# Patient Record
Sex: Female | Born: 1990 | Race: White | Hispanic: No | Marital: Married | Smoking: Never smoker
Health system: Southern US, Community
[De-identification: ages and names within clinical notes are randomized; demographics above are authoritative.]

## PROBLEM LIST (undated history)

## (undated) DIAGNOSIS — Z9889 Other specified postprocedural states: Secondary | ICD-10-CM

## (undated) DIAGNOSIS — R112 Nausea with vomiting, unspecified: Secondary | ICD-10-CM

## (undated) DIAGNOSIS — Z789 Other specified health status: Secondary | ICD-10-CM

## (undated) HISTORY — PX: NO PAST SURGERIES: SHX2092

## (undated) HISTORY — PX: WISDOM TOOTH EXTRACTION: SHX21

## (undated) HISTORY — DX: Other specified health status: Z78.9

---

## 2016-12-01 NOTE — L&D Delivery Note (Signed)
Delivery Note Patient pushed for 30 minutes after she was noted to be C/C/+2.  At 9:03 AM a viable and healthy female was delivered via Vaginal, Spontaneous (Presentation: LOA ).  APGAR: 9, 9; weight pending.   Baby was placed on maternal abdomen and delayed cord clamping done Cord was double clamped and cut by father.  Cord blood obtained. Placenta spontaneously delivered intact 3 vessels.  Second degree perineal laceration repaired in routine fashion. Patient tolerated delivery Well, no complications  Anesthesia:   Episiotomy: None Lacerations: 2nd degree Suture Repair: 2.0 vicryl Est. Blood Loss (mL):  300  Mom to postpartum.  Baby to Couplet care / Skin to Skin.  Hiroshi Krummel, [place] 11/04/2017, 9:38 AM

## 2017-02-26 LAB — OB RESULTS CONSOLE RPR: RPR: NONREACTIVE

## 2017-02-26 LAB — OB RESULTS CONSOLE HIV ANTIBODY (ROUTINE TESTING): HIV: NONREACTIVE

## 2017-02-26 LAB — OB RESULTS CONSOLE GC/CHLAMYDIA
CHLAMYDIA, DNA PROBE: NEGATIVE
Gonorrhea: NEGATIVE

## 2017-02-26 LAB — OB RESULTS CONSOLE RUBELLA ANTIBODY, IGM: Rubella: IMMUNE

## 2017-02-26 LAB — OB RESULTS CONSOLE HEPATITIS B SURFACE ANTIGEN: HEP B S AG: NEGATIVE

## 2017-02-26 LAB — OB RESULTS CONSOLE ABO/RH: RH TYPE: POSITIVE

## 2017-02-26 LAB — OB RESULTS CONSOLE ANTIBODY SCREEN: ANTIBODY SCREEN: NEGATIVE

## 2017-10-01 LAB — OB RESULTS CONSOLE GBS: GBS: NEGATIVE

## 2017-10-10 ENCOUNTER — Inpatient Hospital Stay (HOSPITAL_COMMUNITY): Admission: AD | Admit: 2017-10-10 | Payer: Self-pay | Source: Ambulatory Visit | Admitting: Obstetrics & Gynecology

## 2017-10-27 ENCOUNTER — Telehealth (HOSPITAL_COMMUNITY): Payer: Self-pay | Admitting: *Deleted

## 2017-10-27 ENCOUNTER — Encounter (HOSPITAL_COMMUNITY): Payer: Self-pay | Admitting: *Deleted

## 2017-10-28 ENCOUNTER — Encounter (HOSPITAL_COMMUNITY): Payer: Self-pay | Admitting: *Deleted

## 2017-10-28 NOTE — Telephone Encounter (Signed)
Preadmission screen  

## 2017-11-02 ENCOUNTER — Other Ambulatory Visit: Payer: Self-pay | Admitting: Obstetrics & Gynecology

## 2017-11-03 ENCOUNTER — Encounter (HOSPITAL_COMMUNITY): Payer: Self-pay | Admitting: Certified Registered Nurse Anesthetist

## 2017-11-03 ENCOUNTER — Encounter (HOSPITAL_COMMUNITY): Payer: Self-pay

## 2017-11-03 ENCOUNTER — Inpatient Hospital Stay (HOSPITAL_COMMUNITY): Payer: Managed Care, Other (non HMO)

## 2017-11-03 ENCOUNTER — Inpatient Hospital Stay (HOSPITAL_COMMUNITY)
Admission: RE | Admit: 2017-11-03 | Discharge: 2017-11-06 | DRG: 807 | Disposition: A | Discharge status: Home / Self Care | Payer: Managed Care, Other (non HMO) | Source: Ambulatory Visit | Attending: Obstetrics & Gynecology | Admitting: Obstetrics & Gynecology

## 2017-11-03 DIAGNOSIS — O480 Post-term pregnancy: Secondary | ICD-10-CM | POA: Diagnosis present

## 2017-11-03 DIAGNOSIS — Z3A41 41 weeks gestation of pregnancy: Secondary | ICD-10-CM

## 2017-11-03 DIAGNOSIS — Z3493 Encounter for supervision of normal pregnancy, unspecified, third trimester: Secondary | ICD-10-CM

## 2017-11-03 DIAGNOSIS — Z3490 Encounter for supervision of normal pregnancy, unspecified, unspecified trimester: Secondary | ICD-10-CM

## 2017-11-03 LAB — CBC
HCT: 32.6 % — ABNORMAL LOW (ref 36.0–46.0)
Hemoglobin: 11.1 g/dL — ABNORMAL LOW (ref 12.0–15.0)
MCH: 30.3 pg (ref 26.0–34.0)
MCHC: 34 g/dL (ref 30.0–36.0)
MCV: 89.1 fL (ref 78.0–100.0)
PLATELETS: 188 10*3/uL (ref 150–400)
RBC: 3.66 MIL/uL — ABNORMAL LOW (ref 3.87–5.11)
RDW: 14.3 % (ref 11.5–15.5)
WBC: 10.9 10*3/uL — ABNORMAL HIGH (ref 4.0–10.5)

## 2017-11-03 LAB — TYPE AND SCREEN
ABO/RH(D): O POS
Antibody Screen: NEGATIVE

## 2017-11-03 LAB — ABO/RH: ABO/RH(D): O POS

## 2017-11-03 LAB — RPR: RPR: NONREACTIVE

## 2017-11-03 MED ORDER — NALBUPHINE HCL 10 MG/ML IJ SOLN
10.0000 mg | INTRAMUSCULAR | Status: DC | PRN
  Administered 2017-11-03: 10 mg via INTRAVENOUS
  Filled 2017-11-03: qty 1

## 2017-11-03 MED ORDER — FENTANYL 2.5 MCG/ML BUPIVACAINE 1/10 % EPIDURAL INFUSION (WH - ANES)
INTRAMUSCULAR | Status: AC
Start: 2017-11-03 — End: 2017-11-04
  Filled 2017-11-03: qty 100

## 2017-11-03 MED ORDER — LACTATED RINGERS IV SOLN
INTRAVENOUS | Status: DC
  Administered 2017-11-03 (×3): via INTRAVENOUS

## 2017-11-03 MED ORDER — LIDOCAINE HCL (PF) 1 % IJ SOLN
30.0000 mL | INTRAMUSCULAR | Status: DC | PRN
  Filled 2017-11-03: qty 30

## 2017-11-03 MED ORDER — OXYCODONE-ACETAMINOPHEN 5-325 MG PO TABS: 2.0000 | ORAL_TABLET | ORAL | Status: DC | PRN

## 2017-11-03 MED ORDER — ONDANSETRON HCL 4 MG/2ML IJ SOLN
4.0000 mg | Freq: Four times a day (QID) | INTRAMUSCULAR | Status: DC | PRN
  Administered 2017-11-04 (×2): 4 mg via INTRAVENOUS
  Filled 2017-11-03 (×2): qty 2

## 2017-11-03 MED ORDER — LACTATED RINGERS IV SOLN: 500.0000 mL | INTRAVENOUS | Status: DC | PRN

## 2017-11-03 MED ORDER — ACETAMINOPHEN 325 MG PO TABS: 650.0000 mg | ORAL_TABLET | ORAL | Status: DC | PRN

## 2017-11-03 MED ORDER — SOD CITRATE-CITRIC ACID 500-334 MG/5ML PO SOLN: 30.0000 mL | ORAL | Status: DC | PRN

## 2017-11-03 MED ORDER — PHENYLEPHRINE 40 MCG/ML (10ML) SYRINGE FOR IV PUSH (FOR BLOOD PRESSURE SUPPORT)
PREFILLED_SYRINGE | INTRAVENOUS | Status: AC
  Filled 2017-11-03: qty 20

## 2017-11-03 MED ORDER — OXYTOCIN 40 UNITS IN LACTATED RINGERS INFUSION - SIMPLE MED: 2.5000 [IU]/h | INTRAVENOUS | Status: DC

## 2017-11-03 MED ORDER — FLEET ENEMA 7-19 GM/118ML RE ENEM: 1.0000 | ENEMA | RECTAL | Status: DC | PRN

## 2017-11-03 MED ORDER — FENTANYL CITRATE (PF) 100 MCG/2ML IJ SOLN
50.0000 ug | INTRAMUSCULAR | Status: DC | PRN
  Filled 2017-11-03: qty 2

## 2017-11-03 MED ORDER — LACTATED RINGERS IV SOLN
500.0000 mL | INTRAVENOUS | Status: DC | PRN
  Administered 2017-11-04: 500 mL via INTRAVENOUS

## 2017-11-03 MED ORDER — TERBUTALINE SULFATE 1 MG/ML IJ SOLN: 0.2500 mg | Freq: Once | INTRAMUSCULAR | Status: DC | PRN

## 2017-11-03 MED ORDER — OXYCODONE-ACETAMINOPHEN 5-325 MG PO TABS: 1.0000 | ORAL_TABLET | ORAL | Status: DC | PRN

## 2017-11-03 MED ORDER — MISOPROSTOL 25 MCG QUARTER TABLET
25.0000 ug | ORAL_TABLET | ORAL | Status: DC | PRN
  Administered 2017-11-03 (×2): 25 ug via VAGINAL
  Filled 2017-11-03 (×2): qty 1

## 2017-11-03 MED ORDER — OXYTOCIN BOLUS FROM INFUSION
500.0000 mL | Freq: Once | INTRAVENOUS | Status: AC
  Administered 2017-11-04: 500 mL via INTRAVENOUS

## 2017-11-03 MED ORDER — FENTANYL CITRATE (PF) 100 MCG/2ML IJ SOLN
100.0000 ug | INTRAMUSCULAR | Status: DC | PRN
  Administered 2017-11-03 (×2): 100 ug via INTRAVENOUS
  Filled 2017-11-03: qty 2

## 2017-11-03 MED ORDER — OXYTOCIN 40 UNITS IN LACTATED RINGERS INFUSION - SIMPLE MED
1.0000 m[IU]/min | INTRAVENOUS | Status: DC
  Administered 2017-11-03: 1 m[IU]/min via INTRAVENOUS
  Filled 2017-11-03: qty 1000

## 2017-11-03 NOTE — Progress Notes (Signed)
Admission nutrition screen triggered for unintentional weight loss > 10 lbs within the last month. Review of PNR indicates no weight loss. Patients chart reviewed and assessed  for nutritional risk. Patient is determined to be at low nutrition  risk.    Rebecca Moses.Fredderick Severance LDN Neonatal Nutrition Support Specialist/RD III Pager [phone]      Phone [phone]

## 2017-11-03 NOTE — Progress Notes (Signed)
Rebecca Moses is a 26 y.o. G1P0 at [redacted]w[redacted]d   Subjective: No complaints.  Feels contractions 3/10 on pain scale.  Objective: BP 118/68   Pulse 91   Temp 98 F (36.7 C) (Oral)   Resp 16   Ht 5\' 5"  (1.651 m)   Wt 79.4 kg (175 lb)   LMP 01/02/2017   BMI 29.12 kg/m  No intake/output data recorded. No intake/output data recorded.  FHT:  Cat 1 UC:   regular, every 1-2 minutes SVE:   Dilation: 1 Effacement (%): Thick Station: -3 Exam by:: Dr Mancel Bale  Labs: Lab Results  Component Value Date   WBC 10.9 (H) 11/03/2017   HGB 11.1 (L) 11/03/2017   HCT 32.6 (L) 11/03/2017   MCV 89.1 11/03/2017   PLT 188 11/03/2017    Assessment / Plan: Induction  Labor: cervical ripening with cytotec.  If contracting as much as she is now, will hold 3rd cytotec and start low dose pitocin. Preeclampsia:  no signs or symptoms of toxicity Fetal Wellbeing:  Category I Pain Control:  Labor support without medications. Pain meds upon request. I/D:  GBS neg Anticipated MOD:  NSVD  [name] 11/03/2017, 4:37 PM

## 2017-11-03 NOTE — Progress Notes (Addendum)
Rebecca Moses is a 26 y.o. G1P0 at [redacted]w[redacted]d undergoing post dates induction.  Subjective: No complaints  Objective: BP 118/68   Pulse 91   Temp 98 F (36.7 C) (Oral)   Resp 16   Ht 5\' 5"  (1.651 m)   Wt 79.4 kg (175 lb)   LMP 01/02/2017   BMI 29.12 kg/m  No intake/output data recorded. No intake/output data recorded.  FHT:  FHR: 140s bpm, variability: min-mod,  accelerations:  Present,  decelerations:  Absent UC:   regular, every 2-3 minutes SVE:   Dilation: 1 Effacement (%): Thick Station: -3 Exam by:: Dr Mancel Bale 2nd cytotec placed in posterior fornix  Labs: Lab Results  Component Value Date   WBC 10.9 (H) 11/03/2017   HGB 11.1 (L) 11/03/2017   HCT 32.6 (L) 11/03/2017   MCV 89.1 11/03/2017   PLT 188 11/03/2017    Assessment / Plan: induction  Labor: cervical ripening with cytotec.  s/p 2nd dose at 12:50p Preeclampsia:  no signs or symptoms of toxicity Fetal Wellbeing:  Category I Pain Control:  Pain medicine upon request I/D:  GBS neg Anticipated MOD:  NSVD  [name] 11/03/2017, 2:27 PM

## 2017-11-03 NOTE — Anesthesia Pain Management Evaluation Note (Signed)
  CRNA Pain Management Visit Note  Patient: Rebecca Moses, 26 y.o., female  "Hello I am a member of the anesthesia team at [facility]. We have an anesthesia team available at all times to provide care throughout the hospital, including epidural management and anesthesia for C-section. I don't know your plan for the delivery whether it a natural birth, water birth, IV sedation, nitrous supplementation, doula or epidural, but we want to meet your pain goals."   1.Was your pain managed to your expectations on prior hospitalizations?   No prior hospitalizations  2.What is your expectation for pain management during this hospitalization?     Epidural  3.How can we help you reach that goal? Epidural when ready. Patient is aware of all pain control options.  Record the patient's initial score and the patient's pain goal.   Pain: 0  Pain Goal: 5 [facility] wants you to be able to say your pain was always managed very well.  Rebecca Moses L 11/03/2017

## 2017-11-03 NOTE — H&P (Addendum)
Rebecca Moses is a 26 y.o. female presenting for induction of labor.  OB History    Gravida Para Term Preterm AB Living   1             SAB TAB Ectopic Multiple Live Births                 Past Medical History:  Diagnosis Date  . Medical history non-contributory    Past Surgical History:  Procedure Laterality Date  . NO PAST SURGERIES     Family History: family history includes Diabetes in her paternal grandfather; Skin cancer in her father, mother, paternal grandfather, and paternal grandmother. Social History:  reports that  has never smoked. she has never used smokeless tobacco. She reports that she does not drink alcohol or use drugs.     Maternal Diabetes: No Genetic Screening: Normal Maternal Ultrasounds/Referrals: Normal Fetal Ultrasounds or other Referrals:  None Maternal Substance Abuse:  No Significant Maternal Medications:  None Significant Maternal Lab Results:  Lab values include: Group B Strep negative Other Comments:  None  ROS  Non-contributory  History Dilation: Fingertip Effacement (%): Thick Station: -3 Exam by:: S Nix RN Blood pressure 117/79, pulse 84, temperature 97.9 F (36.6 C), temperature source Oral, resp. rate 16, height 5\' 5"  (1.651 m), weight 79.4 kg (175 lb), last menstrual period 01/02/2017. Exam   Physical Exam  Lungs CTA CV RRR Abd gravid, NT Ext no calf tenderness  Prenatal labs: ABO, Rh: O/Positive/-- (03/29 0000) Antibody: Negative (03/29 0000) Rubella: Immune (03/29 0000) RPR: Nonreactive (03/29 0000)  HBsAg: Negative (03/29 0000)  HIV: Non-reactive (03/29 0000)  GBS: Negative (11/01 0000)   Assessment/Plan: P0 at 41wks admitted for induction with unfavorable cervix.  Confirm cephalic with ultrasound and start cervical ripening with cytotec.  Fetal status reassuring/cat 1.  Pain medicine upon request.   [name] 11/03/2017, 8:43 AM

## 2017-11-03 NOTE — Progress Notes (Signed)
Subjective: Pt breathing with contractions.  Husband in room.  Has had one dose of fentanyl. Objective: BP 116/67   Pulse 86   Temp 98 F (36.7 C)   Resp 16   Ht 5\' 5"  (1.651 m)   Wt 79.4 kg (175 lb)   LMP 01/02/2017   BMI 29.12 kg/m  No intake/output data recorded. No intake/output data recorded.  FHT: Category 1 UC:   regular, every 2-3 minutes SVE:   Dilation: 1.5 Effacement (%): 50 Station: -2 Exam by:: Izora Gala, CNM Pitocin at 4 mu Foley bulb placed without difficulty.  Procedure explained before placement.  Pt tolerated procedure well.  Assessment:  G1P0 at 41 weeks induction for postdates. Cat 1 strip   Plan: Pain medication as needed.  Epidural when request Anticipate SVD  Starla Link CNM, MSN 11/03/2017, 9:10 PM

## 2017-11-04 ENCOUNTER — Other Ambulatory Visit: Payer: Self-pay

## 2017-11-04 ENCOUNTER — Inpatient Hospital Stay (HOSPITAL_COMMUNITY): Payer: Managed Care, Other (non HMO) | Admitting: Anesthesiology

## 2017-11-04 ENCOUNTER — Encounter (HOSPITAL_COMMUNITY): Payer: Self-pay

## 2017-11-04 MED ORDER — ONDANSETRON HCL 4 MG/2ML IJ SOLN: 4.0000 mg | Freq: Three times a day (TID) | INTRAMUSCULAR | Status: DC | PRN

## 2017-11-04 MED ORDER — FENTANYL 2.5 MCG/ML BUPIVACAINE 1/10 % EPIDURAL INFUSION (WH - ANES)
14.0000 mL/h | INTRAMUSCULAR | Status: DC | PRN
  Administered 2017-11-04: 14 mL/h via EPIDURAL
  Filled 2017-11-04: qty 100

## 2017-11-04 MED ORDER — LACTATED RINGERS IV SOLN
500.0000 mL | Freq: Once | INTRAVENOUS | Status: AC
  Administered 2017-11-03: 500 mL via INTRAVENOUS

## 2017-11-04 MED ORDER — ONDANSETRON HCL 4 MG/2ML IJ SOLN: 4.0000 mg | INTRAMUSCULAR | Status: DC | PRN

## 2017-11-04 MED ORDER — KETOROLAC TROMETHAMINE 30 MG/ML IJ SOLN: 30.0000 mg | Freq: Four times a day (QID) | INTRAMUSCULAR | Status: DC | PRN

## 2017-11-04 MED ORDER — NALBUPHINE HCL 10 MG/ML IJ SOLN: 5.0000 mg | INTRAMUSCULAR | Status: DC | PRN

## 2017-11-04 MED ORDER — EPHEDRINE 5 MG/ML INJ
10.0000 mg | INTRAVENOUS | Status: DC | PRN
  Filled 2017-11-04: qty 2

## 2017-11-04 MED ORDER — NALOXONE HCL 0.4 MG/ML IJ SOLN: 0.4000 mg | INTRAMUSCULAR | Status: DC | PRN

## 2017-11-04 MED ORDER — ONDANSETRON HCL 4 MG PO TABS: 4.0000 mg | ORAL_TABLET | ORAL | Status: DC | PRN

## 2017-11-04 MED ORDER — PHENYLEPHRINE 40 MCG/ML (10ML) SYRINGE FOR IV PUSH (FOR BLOOD PRESSURE SUPPORT)
80.0000 ug | PREFILLED_SYRINGE | INTRAVENOUS | Status: DC | PRN
  Filled 2017-11-04: qty 5

## 2017-11-04 MED ORDER — OXYCODONE-ACETAMINOPHEN 5-325 MG PO TABS: 2.0000 | ORAL_TABLET | ORAL | Status: DC | PRN

## 2017-11-04 MED ORDER — BENZOCAINE-MENTHOL 20-0.5 % EX AERO
1.0000 "application " | INHALATION_SPRAY | CUTANEOUS | Status: DC | PRN
  Administered 2017-11-04: 1 via TOPICAL
  Filled 2017-11-04 (×2): qty 56

## 2017-11-04 MED ORDER — WITCH HAZEL-GLYCERIN EX PADS: 1.0000 "application " | MEDICATED_PAD | CUTANEOUS | Status: DC | PRN

## 2017-11-04 MED ORDER — TETANUS-DIPHTH-ACELL PERTUSSIS 5-2.5-18.5 LF-MCG/0.5 IM SUSP: 0.5000 mL | Freq: Once | INTRAMUSCULAR | Status: DC

## 2017-11-04 MED ORDER — SODIUM CHLORIDE 0.9% FLUSH: 3.0000 mL | INTRAVENOUS | Status: DC | PRN

## 2017-11-04 MED ORDER — DIPHENHYDRAMINE HCL 25 MG PO CAPS: 25.0000 mg | ORAL_CAPSULE | Freq: Four times a day (QID) | ORAL | Status: DC | PRN

## 2017-11-04 MED ORDER — DIPHENHYDRAMINE HCL 50 MG/ML IJ SOLN: 12.5000 mg | INTRAMUSCULAR | Status: DC | PRN

## 2017-11-04 MED ORDER — NALBUPHINE HCL 10 MG/ML IJ SOLN: 5.0000 mg | Freq: Once | INTRAMUSCULAR | Status: DC | PRN

## 2017-11-04 MED ORDER — ZOLPIDEM TARTRATE 5 MG PO TABS: 5.0000 mg | ORAL_TABLET | Freq: Every evening | ORAL | Status: DC | PRN

## 2017-11-04 MED ORDER — COCONUT OIL OIL
1.0000 "application " | TOPICAL_OIL | Status: DC | PRN
  Administered 2017-11-04: 1 via TOPICAL
  Filled 2017-11-04: qty 120

## 2017-11-04 MED ORDER — LIDOCAINE HCL (PF) 1 % IJ SOLN
INTRAMUSCULAR | Status: DC | PRN
  Administered 2017-11-04 (×2): 5 mL via EPIDURAL

## 2017-11-04 MED ORDER — DIPHENHYDRAMINE HCL 25 MG PO CAPS: 25.0000 mg | ORAL_CAPSULE | ORAL | Status: DC | PRN

## 2017-11-04 MED ORDER — IBUPROFEN 600 MG PO TABS
600.0000 mg | ORAL_TABLET | Freq: Four times a day (QID) | ORAL | Status: DC
  Administered 2017-11-04 – 2017-11-06 (×9): 600 mg via ORAL
  Filled 2017-11-04 (×9): qty 1

## 2017-11-04 MED ORDER — FENTANYL 2.5 MCG/ML BUPIVACAINE 1/10 % EPIDURAL INFUSION (WH - ANES)
INTRAMUSCULAR | Status: DC | PRN
  Administered 2017-11-04: 14 mL/h via EPIDURAL

## 2017-11-04 MED ORDER — ACETAMINOPHEN 325 MG PO TABS
650.0000 mg | ORAL_TABLET | ORAL | Status: DC | PRN
  Administered 2017-11-05: 650 mg via ORAL
  Filled 2017-11-04: qty 2

## 2017-11-04 MED ORDER — OXYTOCIN 40 UNITS IN LACTATED RINGERS INFUSION - SIMPLE MED: 1.0000 m[IU]/min | INTRAVENOUS | Status: DC

## 2017-11-04 MED ORDER — PRENATAL MULTIVITAMIN CH
1.0000 | ORAL_TABLET | Freq: Every day | ORAL | Status: DC
  Administered 2017-11-04 – 2017-11-06 (×3): 1 via ORAL
  Filled 2017-11-04 (×3): qty 1

## 2017-11-04 MED ORDER — OXYTOCIN 40 UNITS IN LACTATED RINGERS INFUSION - SIMPLE MED: 2.5000 [IU]/h | INTRAVENOUS | Status: DC | PRN

## 2017-11-04 MED ORDER — OXYCODONE-ACETAMINOPHEN 5-325 MG PO TABS: 1.0000 | ORAL_TABLET | ORAL | Status: DC | PRN

## 2017-11-04 MED ORDER — DIBUCAINE 1 % RE OINT: 1.0000 "application " | TOPICAL_OINTMENT | RECTAL | Status: DC | PRN

## 2017-11-04 MED ORDER — NALOXONE HCL 0.4 MG/ML IJ SOLN: 1.0000 ug/kg/h | INTRAMUSCULAR | Status: DC | PRN

## 2017-11-04 MED ORDER — SCOPOLAMINE 1 MG/3DAYS TD PT72
1.0000 | MEDICATED_PATCH | Freq: Once | TRANSDERMAL | Status: DC
  Filled 2017-11-04: qty 1

## 2017-11-04 MED ORDER — SIMETHICONE 80 MG PO CHEW: 80.0000 mg | CHEWABLE_TABLET | ORAL | Status: DC | PRN

## 2017-11-04 MED ORDER — SENNOSIDES-DOCUSATE SODIUM 8.6-50 MG PO TABS
2.0000 | ORAL_TABLET | ORAL | Status: DC
  Administered 2017-11-04 – 2017-11-05 (×2): 2 via ORAL
  Filled 2017-11-04 (×2): qty 2

## 2017-11-04 NOTE — Progress Notes (Signed)
Subjective: Comfortable with epidural  Objective: BP (!) 119/58   Pulse (!) 109   Temp 99.2 F (37.3 C) (Oral)   Resp 16   Ht 5\' 5"  (1.651 m)   Wt 79.4 kg (175 lb)   LMP 01/02/2017   SpO2 97%   BMI 29.12 kg/m  No intake/output data recorded. No intake/output data recorded.  FHT: Category 1  FHT 145 accel present no decels, varibility present UC:   regular, every 3 minutes SVE:   Dilation: 6 Effacement (%): 70, 80 Station: -1, -2 Exam by:: Rebecca Moses, CNM Pitocin at 8 mu IUPC placed without difficulty   Assessment:  G1P0 at 41 weeks induction for postdates. Cat 1 strip  Plan: Continue with pitocin for adequate contractions.  Rebecca Moses CNM, MSN 11/04/2017, 5:50 AM

## 2017-11-04 NOTE — Plan of Care (Signed)
  Progressing Education: Knowledge of condition will improve 11/04/2017 1717 - Progressing by Lucinda Dell, RN Note Pt able to teach back admission info given & all education. Activity: Will verbalize the importance of balancing activity with adequate rest periods 11/04/2017 1717 - Progressing by Lucinda Dell, RN Ability to tolerate increased activity will improve 11/04/2017 1717 - Progressing by Lucinda Dell, RN Life Cycle: Chance of risk for complications during the postpartum period will decrease 11/04/2017 1717 - Progressing by Lucinda Dell, RN Note Fundas firm, but will be midline if pt has not voided.  Pt encouraged to void frequently to keep lochia at small amt of fundas midline. Role Relationship: Ability to demonstrate positive interaction with newborn will improve 11/04/2017 1717 - Progressing by Elza Rafter D, RN Note Mother very engaged in care of infant as evidenced by skin to skin & patience with feeding infant for long periods of time.

## 2017-11-04 NOTE — Progress Notes (Signed)
Subjective: Comfortable with epidural Objective: BP 121/78   Pulse (!) 101   Temp 98.3 F (36.8 C) (Oral)   Resp 16   Ht 5\' 5"  (1.651 m)   Wt 79.4 kg (175 lb)   LMP 01/02/2017   SpO2 97%   BMI 29.12 kg/m  No intake/output data recorded. No intake/output data recorded.  FHT: Category 1 UC:   regular, every 2-3 minutes SVE:   Dilation: 5.5 Effacement (%): 80 Station: -1, -2 Exam by:: Lamonte Sakai, RNC Pitocin at 4 mu AROM clear fluid  Assessment:  G1P0 at 41 weeks induction for postdates. Cat 1 strip  Plan: Continue with pitocin Anticipate SVD Starla Link CNM, MSN 11/04/2017, 1:38 AM

## 2017-11-04 NOTE — Anesthesia Preprocedure Evaluation (Signed)

## 2017-11-04 NOTE — Anesthesia Procedure Notes (Signed)
Epidural Patient location during procedure: OB Start time: 11/04/2017 12:00 AM End time: 11/04/2017 12:05 AM  Staffing Anesthesiologist: Janeece Riggers, MD  Preanesthetic Checklist Completed: patient identified, site marked, surgical consent, pre-op evaluation, timeout performed, IV checked, risks and benefits discussed and monitors and equipment checked  Epidural Patient position: sitting Prep: site prepped and draped and DuraPrep Patient monitoring: continuous pulse ox and blood pressure Approach: midline Location: L4-L5 Injection technique: LOR air  Needle:  Needle type: Tuohy  Needle gauge: 17 G Needle length: 9 cm and 9 Needle insertion depth: 6 cm Catheter type: closed end flexible Catheter size: 19 Gauge Catheter at skin depth: 11 cm Test dose: negative  Assessment Events: blood not aspirated, injection not painful, no injection resistance, negative IV test and no paresthesia

## 2017-11-04 NOTE — Lactation Note (Signed)
This note was copied from a baby's chart. Lactation Consultation Note  Patient Name: Rebecca Moses GLOVF'I Date: 11/04/2017 Reason for consult: Initial assessment  Baby 33 hours old. Mom reports that she attempted to latch baby a little earlier, but baby was sleepy at breast. Mom requested help with latching from [facility], so assisted mom with positioning of pillows and attempted to latch baby in football position to right breast. Baby latched and suckled several times, but then would become sleepy at the breast. Discussed infant behavior and enc mom to continue offering lots of STS and attempts at breast. Mom easily expressible with colostrum flowing bilaterally. Mom given [facility] brochure, aware of OP/BFSG and [place] phone line assistance after D/C.   Maternal Data Has patient been taught Hand Expression?: Yes Does the patient have breastfeeding experience prior to this delivery?: No  Feeding Feeding Type: Breast Fed Length of feed: 20 min  LATCH Score Latch: Too sleepy or reluctant, no latch achieved, no sucking elicited.  Audible Swallowing: None  Type of Nipple: Everted at rest and after stimulation  Comfort (Breast/Nipple): Soft / non-tender  Hold (Positioning): Assistance needed to correctly position infant at breast and maintain latch.  LATCH Score: 7  Interventions Interventions: Breast feeding basics reviewed;Assisted with latch;Skin to skin;Hand express;Adjust position;Breast compression;Support pillows;Position options  Lactation Tools Discussed/Used     Consult Status Consult Status: Follow-up Date: 11/05/17 Follow-up type: In-patient    Andres Labrum 11/04/2017, 2:50 PM

## 2017-11-04 NOTE — Anesthesia Postprocedure Evaluation (Signed)
Anesthesia Post Note  Patient: Rebecca Moses  Procedure(s) Performed: AN AD HOC LABOR EPIDURAL     Patient location during evaluation: Mother Baby Anesthesia Type: Epidural Level of consciousness: awake and alert and oriented Pain management: satisfactory to patient Vital Signs Assessment: post-procedure vital signs reviewed and stable Respiratory status: spontaneous breathing and nonlabored ventilation Cardiovascular status: stable Postop Assessment: no headache, no backache, no signs of nausea or vomiting, adequate PO intake and patient able to bend at knees (patient up walking) Anesthetic complications: no    Last Vitals:  Vitals:   11/04/17 1050 11/04/17 1149  BP: 130/77 133/83  Pulse: 89 (!) 101  Resp: 18   Temp: 36.7 C 36.8 C  SpO2: 98% 100%    Last Pain:  Vitals:   11/04/17 1149  TempSrc: Oral  PainSc:    Pain Goal:                 Donovin Kraemer

## 2017-11-05 LAB — CBC
HCT: 31.8 % — ABNORMAL LOW (ref 36.0–46.0)
Hemoglobin: 10.9 g/dL — ABNORMAL LOW (ref 12.0–15.0)
MCH: 30.8 pg (ref 26.0–34.0)
MCHC: 34.3 g/dL (ref 30.0–36.0)
MCV: 89.8 fL (ref 78.0–100.0)
PLATELETS: 186 10*3/uL (ref 150–400)
RBC: 3.54 MIL/uL — AB (ref 3.87–5.11)
RDW: 14.3 % (ref 11.5–15.5)
WBC: 19.9 10*3/uL — ABNORMAL HIGH (ref 4.0–10.5)

## 2017-11-05 MED ORDER — IBUPROFEN 600 MG PO TABS: 600.0000 mg | ORAL_TABLET | Freq: Four times a day (QID) | ORAL | 1 refills | Status: DC | PRN

## 2017-11-05 NOTE — Plan of Care (Signed)
  Progressing Activity: Will verbalize the importance of balancing activity with adequate rest periods 11/05/2017 1701 - Progressing by Lucinda Dell, RN    Adequate for Discharge Education: Knowledge of condition will improve 11/05/2017 1701 - Adequate for Discharge by Lucinda Dell, RN Activity: Ability to tolerate increased activity will improve 11/05/2017 1701 - Adequate for Discharge by Lucinda Dell, RN Note Pt ambulates in room without difficulty & tolerates well.  Pt encouraged to ambulate in hallway with infant. Coping: Ability to identify and utilize available resources and services will improve 11/05/2017 1701 - Adequate for Discharge by Lucinda Dell, RN Role Relationship: Ability to demonstrate positive interaction with newborn will improve 11/05/2017 1701 - Adequate for Discharge by Lucinda Dell, RN   No Outcome Life Cycle: Chance of risk for complications during the postpartum period will decrease 11/05/2017 1701 by Lucinda Dell, RN Note Fundas firm & midline.  Lochia WNL, without clots.  Pt encouraged to keep bladder empty.

## 2017-11-05 NOTE — Lactation Note (Signed)
This note was copied from a baby's chart. Lactation Consultation Note  Patient Name: Rebecca Moses [id number] Date: 11/05/2017   P1, Baby 55 hours old.  Latched in cross cradle upon entering. Baby has been feeding well per parents with numerous voids/stools. Baby sleepy at the breast. Mother unlatched baby.  Nipples sore.  Abrasion on L nipple. Provided mother w/ comfort gels with instruction. Suggest not allowing baby to hang out on tips of nipples doing NNS. Placed baby STS on mother's chest. Discussed depth and breast compression to keep baby active.      Maternal Data    Feeding Feeding Type: Breast Fed Length of feed: 20 min  LATCH Score Latch: Repeated attempts needed to sustain latch, nipple held in mouth throughout feeding, stimulation needed to elicit sucking reflex.  Audible Swallowing: A few with stimulation  Type of Nipple: Everted at rest and after stimulation  Comfort (Breast/Nipple): Filling, red/small blisters or bruises, mild/mod discomfort  Hold (Positioning): Assistance needed to correctly position infant at breast and maintain latch.  LATCH Score: 6  Interventions    Lactation Tools Discussed/Used     Consult Status      Vivianne Master [facility] 11/05/2017, 4:30 PM

## 2017-11-05 NOTE — Discharge Summary (Addendum)
OB Discharge Summary     Patient Name: Rebecca Moses DOB: 1991/06/21 MRN: [id number]  Date of admission: 11/03/2017 Delivering MD: Sanjuana Kava   Date of discharge: 11/05/2017  Admitting diagnosis: INDUCTION Intrauterine pregnancy: [redacted]w[redacted]d     Secondary diagnosis:  Active Problems:   Post-dates pregnancy   Vaginal delivery  Additional problems: None     Discharge diagnosis: Term Pregnancy Delivered                                                                                                Post partum procedures:None  Augmentation: AROM and Pitocin  Complications: None  Hospital course:  Induction of Labor With Vaginal Delivery   26 y.o. yo G1P1001 at [redacted]w[redacted]d was admitted to the hospital 11/03/2017 for induction of labor.  Indication for induction: Postdates.  Patient had an uncomplicated labor course as follows: Membrane Rupture Time/Date: 1:24 AM ,11/04/2017   Intrapartum Procedures: Episiotomy: None [1]                                         Lacerations:  2nd degree [3]  Patient had delivery of a Viable infant.  Information for the patient's newborn:  Ulyssa, [place] Girl Dakia [[id number]]      11/04/2017  Details of delivery can be found in separate delivery note.  Patient had a routine postpartum course. Patient is discharged home 11/05/17.  Physical exam  Vitals:   11/04/17 1149 11/04/17 1537 11/04/17 2345 11/05/17 0514  BP: 133/83 130/80 116/64 106/63  Pulse: (!) 101 (!) 101 (!) 101 80  Resp:  16 16 18   Temp: 98.3 F (36.8 C) 97.9 F (36.6 C) 98.2 F (36.8 C) 97.8 F (36.6 C)  TempSrc: Oral Oral Oral Oral  SpO2: 100% 99%    Weight:      Height:       General: alert, cooperative and no distress  Chest: Lungs CTA, HRRR Abd: Soft, NT, BS x 4 Lochia: appropriate Uterine Fundus: firm Incision: N/A DVT Evaluation: No cords or calf tenderness. No significant calf/ankle edema. Labs: Lab Results  Component Value Date   WBC 19.9 (H) 11/05/2017   HGB 10.9  (L) 11/05/2017   HCT 31.8 (L) 11/05/2017   MCV 89.8 11/05/2017   PLT 186 11/05/2017   No flowsheet data found.  Discharge instruction: per After Visit Summary and "Baby and Me Booklet". Pain Management, Peri-Care, Breastfeeding, Who and When to call for postpartum complications. Information Sheet(s) given PPD&BB, Contraception Choices   After visit meds:  Allergies as of 11/05/2017   No Known Allergies     Medication List    TAKE these medications   ibuprofen 600 MG tablet Commonly known as:  ADVIL,MOTRIN Take 1 tablet (600 mg total) by mouth every 6 (six) hours as needed.   prenatal multivitamin Tabs tablet Take 1 tablet by mouth daily at 12 noon.       Diet: routine diet  Activity: Advance as tolerated. Pelvic rest for 6 weeks.   Outpatient  follow up:6 weeks Follow up Appt:No future appointments. Follow up Visit:No Follow-up on file.  Postpartum contraception: Undecided  Newborn Data: Live born female  Birth Weight: 8 lb 5 oz (3771 g) APGAR: 42, 9  Newborn Delivery   Birth date/time:  11/04/2017 09:03:00 Delivery type:  Vaginal, Spontaneous     Baby Feeding: Breast Disposition:home with mother   11/06/2017 Yvonne Kendall CNM

## 2017-11-05 NOTE — Discharge Instructions (Signed)
Contraception Choices Contraception (birth control) is the use of any methods or devices to prevent pregnancy. Below are some methods to help avoid pregnancy. Hormonal methods  Contraceptive implant. This is a thin, plastic tube containing progesterone hormone. It does not contain estrogen hormone. Your health care provider inserts the tube in the inner part of the upper arm. The tube can remain in place for up to 3 years. After 3 years, the implant must be removed. The implant prevents the ovaries from releasing an egg (ovulation), thickens the cervical mucus to prevent sperm from entering the uterus, and thins the lining of the inside of the uterus.  Progesterone-only injections. These injections are given every 3 months by your health care provider to prevent pregnancy. This synthetic progesterone hormone stops the ovaries from releasing eggs. It also thickens cervical mucus and changes the uterine lining. This makes it harder for sperm to survive in the uterus.  Birth control pills. These pills contain estrogen and progesterone hormone. They work by preventing the ovaries from releasing eggs (ovulation). They also cause the cervical mucus to thicken, preventing the sperm from entering the uterus. Birth control pills are prescribed by a health care provider.Birth control pills can also be used to treat heavy periods.  Minipill. This type of birth control pill contains only the progesterone hormone. They are taken every day of each month and must be prescribed by your health care provider.  Birth control patch. The patch contains hormones similar to those in birth control pills. It must be changed once a week and is prescribed by a health care provider.  Vaginal ring. The ring contains hormones similar to those in birth control pills. It is left in the vagina for 3 weeks, removed for 1 week, and then a new one is put back in place. The patient must be comfortable inserting and removing the ring from  the vagina.A health care provider's prescription is necessary.  Emergency contraception. Emergency contraceptives prevent pregnancy after unprotected sexual intercourse. This pill can be taken right after sex or up to 5 days after unprotected sex. It is most effective the sooner you take the pills after having sexual intercourse. Most emergency contraceptive pills are available without a prescription. Check with your pharmacist. Do not use emergency contraception as your only form of birth control. Barrier methods  Female condom. This is a thin sheath (latex or rubber) that is worn over the penis during sexual intercourse. It can be used with spermicide to increase effectiveness.  Female condom. This is a soft, loose-fitting sheath that is put into the vagina before sexual intercourse.  Diaphragm. This is a soft, latex, dome-shaped barrier that must be fitted by a health care provider. It is inserted into the vagina, along with a spermicidal jelly. It is inserted before intercourse. The diaphragm should be left in the vagina for 6 to 8 hours after intercourse.  Cervical cap. This is a round, soft, latex or plastic cup that fits over the cervix and must be fitted by a health care provider. The cap can be left in place for up to 48 hours after intercourse.  Sponge. This is a soft, circular piece of polyurethane foam. The sponge has spermicide in it. It is inserted into the vagina after wetting it and before sexual intercourse.  Spermicides. These are chemicals that kill or block sperm from entering the cervix and uterus. They come in the form of creams, jellies, suppositories, foam, or tablets. They do not require a prescription. They  are inserted into the vagina with an applicator before having sexual intercourse. The process must be repeated every time you have sexual intercourse. Intrauterine contraception  Intrauterine device (IUD). This is a T-shaped device that is put in a woman's uterus during  a menstrual period to prevent pregnancy. There are 2 types: ? Copper IUD. This type of IUD is wrapped in copper wire and is placed inside the uterus. Copper makes the uterus and fallopian tubes produce a fluid that kills sperm. It can stay in place for 10 years. ? Hormone IUD. This type of IUD contains the hormone progestin (synthetic progesterone). The hormone thickens the cervical mucus and prevents sperm from entering the uterus, and it also thins the uterine lining to prevent implantation of a fertilized egg. The hormone can weaken or kill the sperm that get into the uterus. It can stay in place for 3-5 years, depending on which type of IUD is used. Permanent methods of contraception  Female tubal ligation. This is when the woman's fallopian tubes are surgically sealed, tied, or blocked to prevent the egg from traveling to the uterus.  Hysteroscopic sterilization. This involves placing a small coil or insert into each fallopian tube. Your doctor uses a technique called hysteroscopy to do the procedure. The device causes scar tissue to form. This results in permanent blockage of the fallopian tubes, so the sperm cannot fertilize the egg. It takes about 3 months after the procedure for the tubes to become blocked. You must use another form of birth control for these 3 months.  Female sterilization. This is when the female has the tubes that carry sperm tied off (vasectomy).This blocks sperm from entering the vagina during sexual intercourse. After the procedure, the man can still ejaculate fluid (semen). Natural planning methods  Natural family planning. This is not having sexual intercourse or using a barrier method (condom, diaphragm, cervical cap) on days the woman could become pregnant.  Calendar method. This is keeping track of the length of each menstrual cycle and identifying when you are fertile.  Ovulation method. This is avoiding sexual intercourse during ovulation.  Symptothermal method.  This is avoiding sexual intercourse during ovulation, using a thermometer and ovulation symptoms.  Post-ovulation method. This is timing sexual intercourse after you have ovulated. Regardless of which type or method of contraception you choose, it is important that you use condoms to protect against the transmission of sexually transmitted infections (STIs). Talk with your health care provider about which form of contraception is most appropriate for you. This information is not intended to replace advice given to you by your health care provider. Make sure you discuss any questions you have with your health care provider. Document Released: 11/17/2005 Document Revised: 04/24/2016 Document Reviewed: 05/12/2013 Elsevier Interactive Patient Education  2017 [employer] Postpartum Depression and Baby Blues The postpartum period begins right after the birth of a baby. During this time, there is often a great amount of joy and excitement. It is also a time of many changes in the life of the parents. Regardless of how many times a mother gives birth, each child brings new challenges and dynamics to the family. It is not unusual to have feelings of excitement along with confusing shifts in moods, emotions, and thoughts. All mothers are at risk of developing postpartum depression or the "baby blues." These mood changes can occur right after giving birth, or they may occur many months after giving birth. The baby blues or postpartum depression can be mild or  severe. Additionally, postpartum depression can go away rather quickly, or it can be a long-term condition. What are the causes? Raised hormone levels and the rapid drop in those levels are thought to be a main cause of postpartum depression and the baby blues. A number of hormones change during and after pregnancy. Estrogen and progesterone usually decrease right after the delivery of your baby. The levels of thyroid hormone and various cortisol steroids  also rapidly drop. Other factors that play a role in these mood changes include major life events and genetics. What increases the risk? If you have any of the following risks for the baby blues or postpartum depression, know what symptoms to watch out for during the postpartum period. Risk factors that may increase the likelihood of getting the baby blues or postpartum depression include:  Having a personal or family history of depression.  Having depression while being pregnant.  Having premenstrual mood issues or mood issues related to oral contraceptives.  Having a lot of life stress.  Having marital conflict.  Lacking a social support network.  Having a baby with special needs.  Having health problems, such as diabetes.  What are the signs or symptoms? Symptoms of baby blues include:  Brief changes in mood, such as going from extreme happiness to sadness.  Decreased concentration.  Difficulty sleeping.  Crying spells, tearfulness.  Irritability.  Anxiety.  Symptoms of postpartum depression typically begin within the first month after giving birth. These symptoms include:  Difficulty sleeping or excessive sleepiness.  Marked weight loss.  Agitation.  Feelings of worthlessness.  Lack of interest in activity or food.  Postpartum psychosis is a very serious condition and can be dangerous. Fortunately, it is rare. Displaying any of the following symptoms is cause for immediate medical attention. Symptoms of postpartum psychosis include:  Hallucinations and delusions.  Bizarre or disorganized behavior.  Confusion or disorientation.  How is this diagnosed? A diagnosis is made by an evaluation of your symptoms. There are no medical or lab tests that lead to a diagnosis, but there are various questionnaires that a health care provider may use to identify those with the baby blues, postpartum depression, or psychosis. Often, a screening tool called the [place]  Postnatal Depression Scale is used to diagnose depression in the postpartum period. How is this treated? The baby blues usually goes away on its own in 1-2 weeks. Social support is often all that is needed. You will be encouraged to get adequate sleep and rest. Occasionally, you may be given medicines to help you sleep. Postpartum depression requires treatment because it can last several months or longer if it is not treated. Treatment may include individual or group therapy, medicine, or both to address any social, physiological, and psychological factors that may play a role in the depression. Regular exercise, a healthy diet, rest, and social support may also be strongly recommended. Postpartum psychosis is more serious and needs treatment right away. Hospitalization is often needed. Follow these instructions at home:  Get as much rest as you can. Nap when the baby sleeps.  Exercise regularly. Some women find yoga and walking to be beneficial.  Eat a balanced and nourishing diet.  Do little things that you enjoy. Have a cup of tea, take a bubble bath, read your favorite magazine, or listen to your favorite music.  Avoid alcohol.  Ask for help with household chores, cooking, grocery shopping, or running errands as needed. Do not try to do everything.  Talk to people  close to you about how you are feeling. Get support from your partner, family members, friends, or other new moms.  Try to stay positive in how you think. Think about the things you are grateful for.  Do not spend a lot of time alone.  Only take over-the-counter or prescription medicine as directed by your health care provider.  Keep all your postpartum appointments.  Let your health care provider know if you have any concerns. Contact a health care provider if: You are having a reaction to or problems with your medicine. Get help right away if:  You have suicidal feelings.  You think you may harm the baby or someone  else. This information is not intended to replace advice given to you by your health care provider. Make sure you discuss any questions you have with your health care provider. Document Released: 08/21/2004 Document Revised: 04/24/2016 Document Reviewed: 08/29/2013 Elsevier Interactive Patient Education  2017 [employer].

## 2017-11-06 NOTE — Lactation Note (Signed)
This note was copied from a baby's chart. Lactation Consultation Note  Patient Name: Rebecca Moses BJYNW'G Date: 11/06/2017 Reason for consult: Follow-up assessment;Nipple pain/trauma   Follow up with mom of 13 hour old infant. Infant with 5 BF for 10-45 minutes, 4 BF attempts, EBM x 1 of 2 cc via spoon, 4 voids, 5 stools, and 2 emesis episodes in the last 24 hours. Infant weight 7 lb 12.9 oz with 6% weight loss since birth. LATCH scores 6-7.   Mom was getting ready to feed infant. Infant was sleepy and mom asked for assistance with latch. Mom with compressible breasts and areola with short shaft everted nipples. Mom with positional stripes to both nipples. Mom reports nipples are feeling better since beginning comfort gels last evening.   Assisted mom with latching infant to the left breast in the cross cradle hold. Infant needed flanging of lips, enc mom to flange lips with each feeding as needed. Mom reports pain with initial latch that improved with feeding. Infant was very sleepy at the breast needing a lot of stimulation to maintain suckling. Mom did well with awakening techniques as needed through feeding. Enc mom to massage/compress breast with feeding, this improved active feeding.  Infant was still feeding when [place] left room, mom reports teaching was helpful. Enc mom to continue hand expression and spoon feeding infant before feeding to stimulate feeding and after feeding to give infant dessert. Infant is jaundiced in appearance, reviewed voiding and stooling helps to eliminate jaundice.   Reviewed I/O, signs of dehydration in the infant, engorgement prevention/treatment and breast milk expression and storage.   [facility] Brochure reviewed, mom aware of OP services, BF Support Groups and [place] phone #. Enc mom to call for LC appt if nipples are not healing in the next few days. Mom voiced understanding.   Maternal Data Formula Feeding for Exclusion: No Has patient been taught Hand  Expression?: Yes Does the patient have breastfeeding experience prior to this delivery?: No  Feeding Feeding Type: Breast Fed Length of feed: 10 min(still feeding when LC left room)  LATCH Score Latch: Repeated attempts needed to sustain latch, nipple held in mouth throughout feeding, stimulation needed to elicit sucking reflex.  Audible Swallowing: A few with stimulation  Type of Nipple: Everted at rest and after stimulation  Comfort (Breast/Nipple): Filling, red/small blisters or bruises, mild/mod discomfort  Hold (Positioning): Assistance needed to correctly position infant at breast and maintain latch.  LATCH Score: 6  Interventions Interventions: Breast feeding basics reviewed;Adjust position;Assisted with latch;Support pillows;Skin to skin;Breast massage;Reverse pressure;Comfort gels  Lactation Tools Discussed/Used Tools: Coconut oil;Comfort gels   Consult Status Consult Status: Follow-up Follow-up type: Call as needed    Donn Pierini 11/06/2017, 9:36 AM

## 2019-03-08 IMAGING — US US MFM OB LIMITED
1 series · 15 of 18 positions shown · non-contrast
Comparison: none

[Series 1: us mfm ob limited · 15 of 18 slices shown]
[im 1/18]
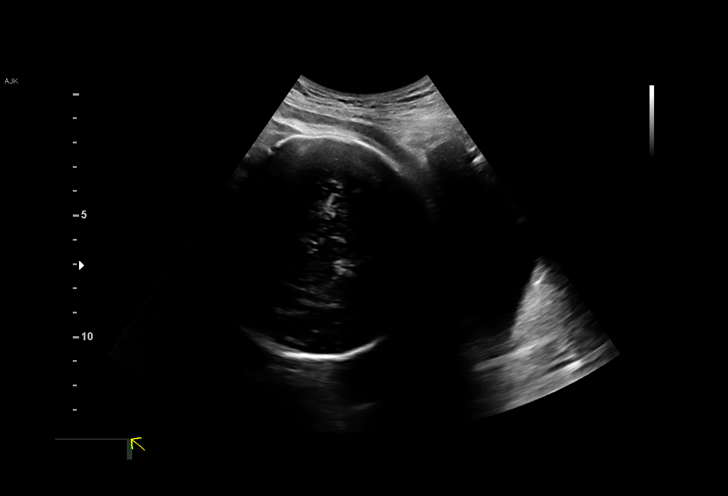
[im 2/18]
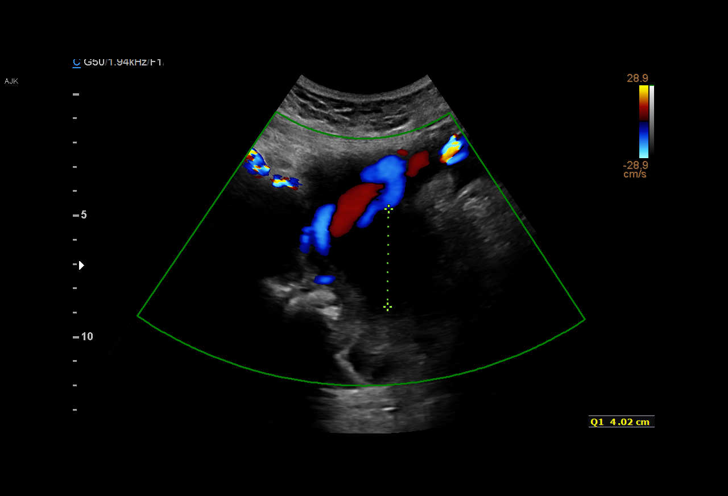
[im 4/18]
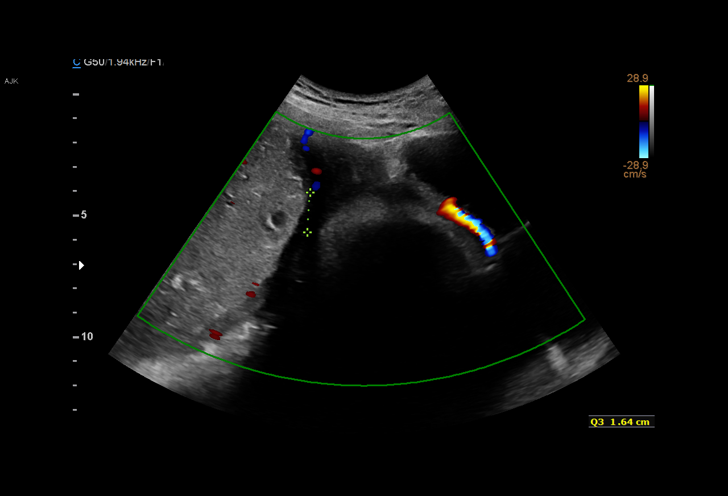
[im 5/18]
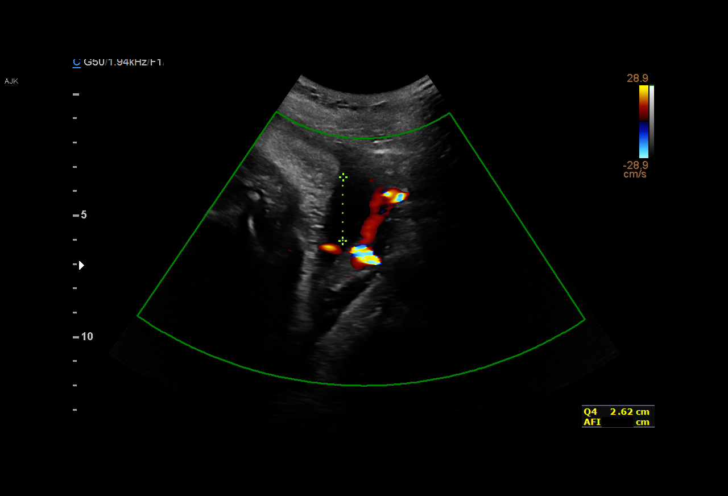
[im 6/18]
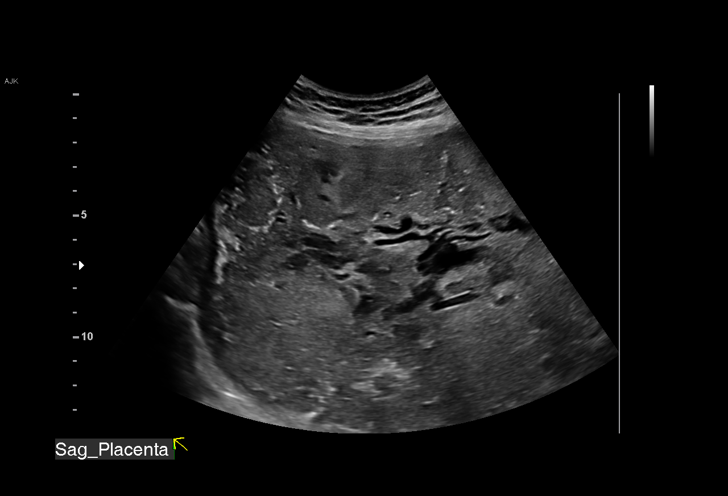
[im 7/18]
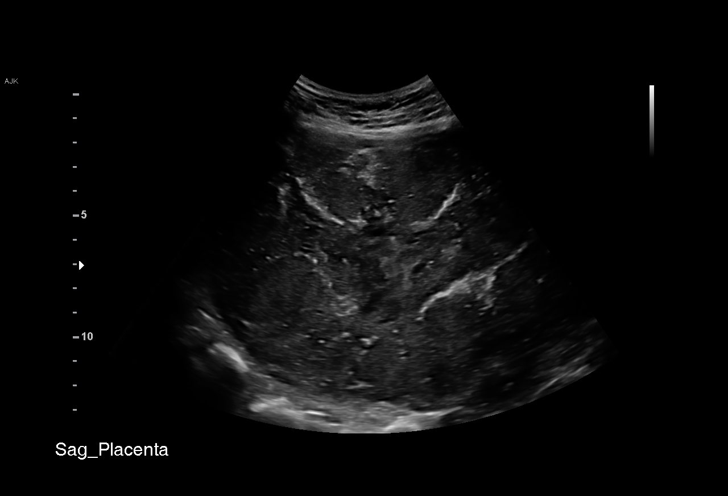
[im 8/18]
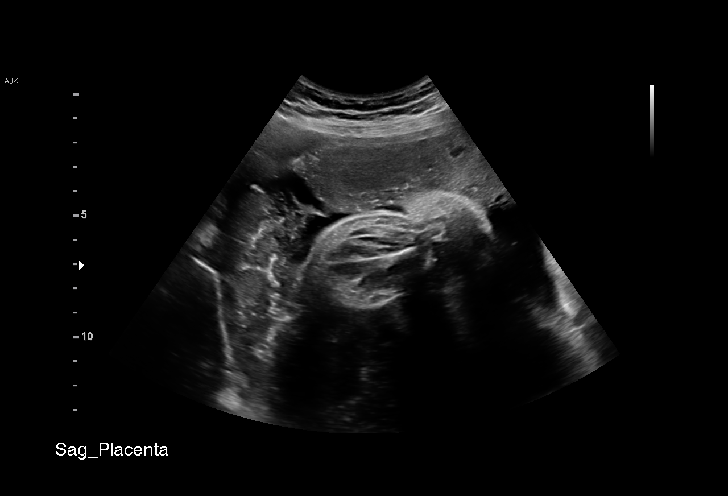
[im 10/18]
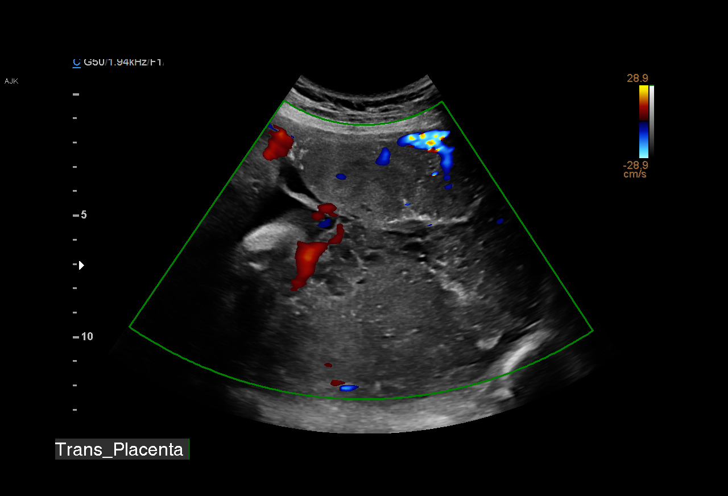
[im 11/18]
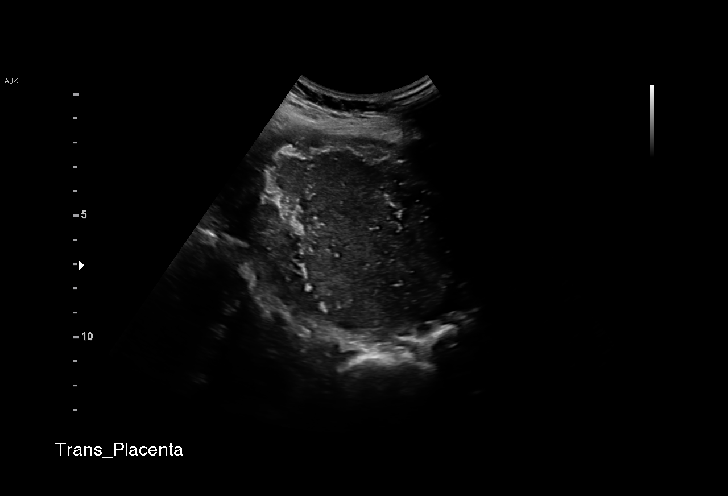
[im 12/18]
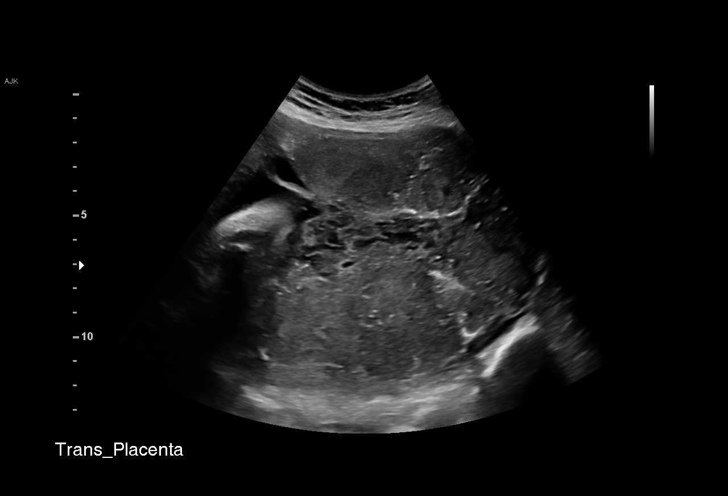
[im 13/18]
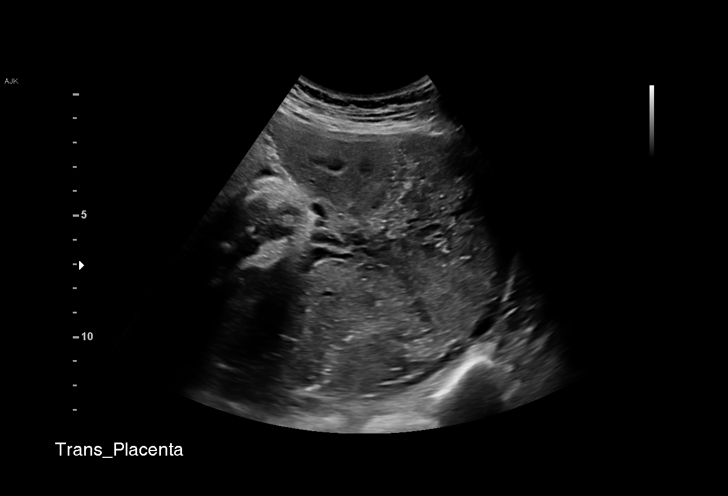
[im 14/18]
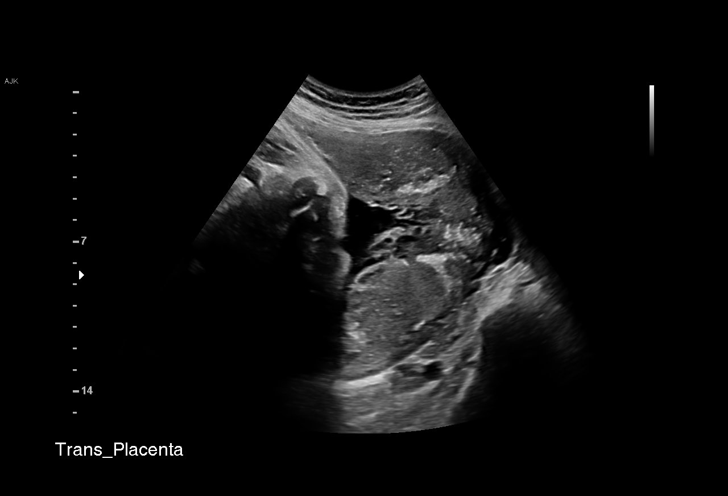
[im 16/18]
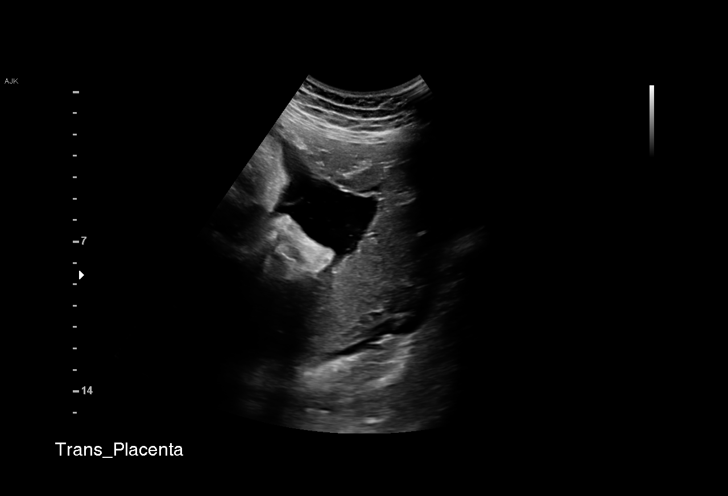
[im 17/18]
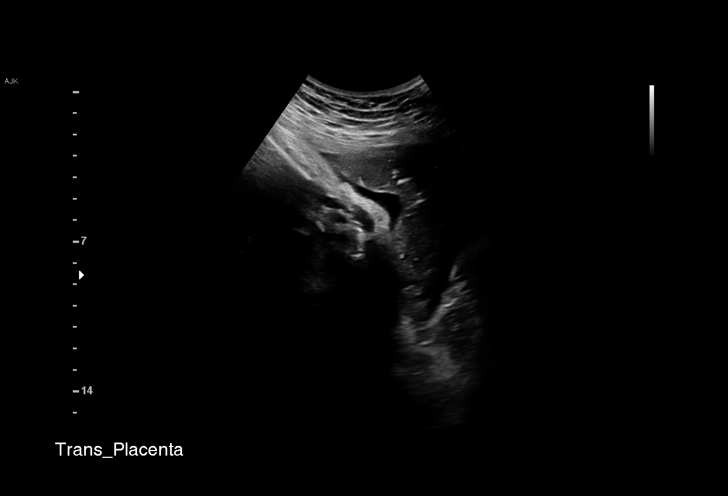
[im 18/18]
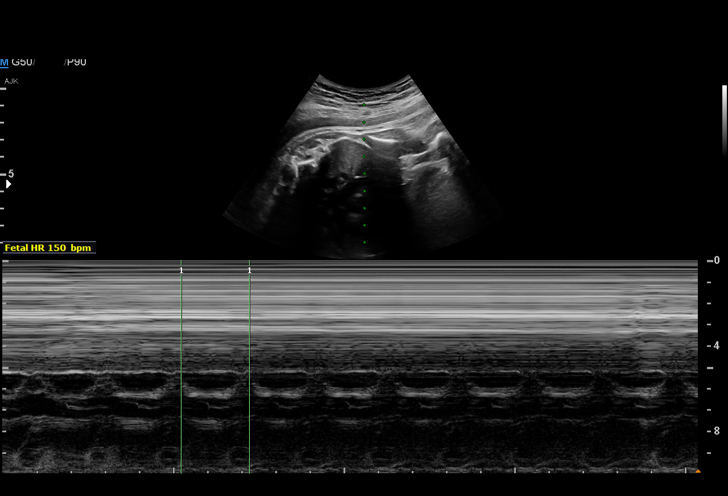

[15 of 18 positions shown; findings below may reference images not displayed]

Obstetrics &
Gynecology
[id number]
[place]

1  KLEVER JUMPER           [id number]      [id number]     [id number]
Indications

41 weeks gestation of pregnancy
Determine Fetal presentation by ultrasound
OB History

Gravidity:    1
Fetal Evaluation

Num Of Fetuses:     1
Fetal Heart         150
Rate(bpm):
Cardiac Activity:   Observed
Presentation:       Cephalic
Placenta:           Left lateral, above cervical os
P. Cord Insertion:  Visualized, central

Amniotic Fluid
AFI FV:      Subjectively within normal limits

AFI Sum(cm)     %Tile       Largest Pocket(cm)
11.86           52

RUQ(cm)       RLQ(cm)       LUQ(cm)        LLQ(cm)
4.02
Gestational Age
LMP:           43w 4d        Date:  01/02/17                 EDD:   10/09/17
Clinical EDD:  41w 0d                                        EDD:   10/27/17
Best:          41w 0d     Det. By:  Clinical EDD             EDD:   10/27/17
Impression

IUP at 41+0 weeks; US evaluation of presentation requested
Cephalic presentation
Normal fetal cardiac activity
Normal amniotic fluid
Normal placentation
Recommendations

Continue clinical evaluation and managment

## 2019-05-05 ENCOUNTER — Encounter (HOSPITAL_BASED_OUTPATIENT_CLINIC_OR_DEPARTMENT_OTHER): Payer: Self-pay | Admitting: *Deleted

## 2019-05-05 ENCOUNTER — Other Ambulatory Visit: Payer: Self-pay

## 2019-05-09 ENCOUNTER — Other Ambulatory Visit (HOSPITAL_COMMUNITY)
Admission: RE | Admit: 2019-05-09 | Discharge: 2019-05-09 | Disposition: A | Discharge status: Home / Self Care | Payer: BC Managed Care – PPO | Source: Ambulatory Visit | Attending: Orthopaedic Surgery | Admitting: Orthopaedic Surgery

## 2019-05-09 DIAGNOSIS — Z1159 Encounter for screening for other viral diseases: Secondary | ICD-10-CM | POA: Insufficient documentation

## 2019-05-09 NOTE — Progress Notes (Signed)
Ensure pre surgical drink given to patient with instructions to finish by 0615 DOS. Verbalized understanding

## 2019-05-10 LAB — NOVEL CORONAVIRUS, NAA (HOSP ORDER, SEND-OUT TO REF LAB; TAT 18-24 HRS): SARS-CoV-2, NAA: NOT DETECTED

## 2019-05-12 ENCOUNTER — Encounter (HOSPITAL_BASED_OUTPATIENT_CLINIC_OR_DEPARTMENT_OTHER)
Admission: RE | Disposition: A | Discharge status: Home / Self Care | Payer: Self-pay | Source: Home / Self Care | Attending: Orthopaedic Surgery

## 2019-05-12 ENCOUNTER — Other Ambulatory Visit: Payer: Self-pay

## 2019-05-12 ENCOUNTER — Encounter (HOSPITAL_BASED_OUTPATIENT_CLINIC_OR_DEPARTMENT_OTHER): Payer: Self-pay | Admitting: Anesthesiology

## 2019-05-12 ENCOUNTER — Ambulatory Visit (HOSPITAL_BASED_OUTPATIENT_CLINIC_OR_DEPARTMENT_OTHER)
Admission: RE | Admit: 2019-05-12 | Discharge: 2019-05-12 | Disposition: A | Discharge status: Home / Self Care | Payer: BC Managed Care – PPO | Attending: Orthopaedic Surgery | Admitting: Orthopaedic Surgery

## 2019-05-12 ENCOUNTER — Ambulatory Visit (HOSPITAL_BASED_OUTPATIENT_CLINIC_OR_DEPARTMENT_OTHER): Payer: BC Managed Care – PPO | Admitting: Anesthesiology

## 2019-05-12 DIAGNOSIS — D481 Neoplasm of uncertain behavior of connective and other soft tissue: Secondary | ICD-10-CM | POA: Diagnosis not present

## 2019-05-12 DIAGNOSIS — M65261 Calcific tendinitis, right lower leg: Secondary | ICD-10-CM | POA: Insufficient documentation

## 2019-05-12 DIAGNOSIS — M12261 Villonodular synovitis (pigmented), right knee: Secondary | ICD-10-CM | POA: Insufficient documentation

## 2019-05-12 DIAGNOSIS — M6751 Plica syndrome, right knee: Secondary | ICD-10-CM | POA: Diagnosis present

## 2019-05-12 HISTORY — PX: KNEE ARTHROSCOPY: SHX127

## 2019-05-12 HISTORY — DX: Other specified postprocedural states: R11.2

## 2019-05-12 HISTORY — PX: MASS EXCISION: SHX2000

## 2019-05-12 HISTORY — DX: Other specified postprocedural states: Z98.890

## 2019-05-12 LAB — POCT PREGNANCY, URINE: Preg Test, Ur: NEGATIVE

## 2019-05-12 SURGERY — ARTHROSCOPY, KNEE
Anesthesia: General | Site: Knee | Laterality: Right

## 2019-05-12 MED ORDER — MIDAZOLAM HCL 2 MG/2ML IJ SOLN
1.0000 mg | INTRAMUSCULAR | Status: DC | PRN
  Administered 2019-05-12: 2 mg via INTRAVENOUS

## 2019-05-12 MED ORDER — OXYCODONE HCL 5 MG/5ML PO SOLN: 5.0000 mg | Freq: Once | ORAL | Status: AC | PRN

## 2019-05-12 MED ORDER — VANCOMYCIN HCL 1 G IV SOLR
INTRAVENOUS | Status: DC | PRN
  Administered 2019-05-12: 1000 mg

## 2019-05-12 MED ORDER — LACTATED RINGERS IV SOLN
INTRAVENOUS | Status: DC
  Administered 2019-05-12: 08:00:00 via INTRAVENOUS

## 2019-05-12 MED ORDER — FENTANYL CITRATE (PF) 100 MCG/2ML IJ SOLN
INTRAMUSCULAR | Status: AC
  Filled 2019-05-12: qty 2

## 2019-05-12 MED ORDER — ONDANSETRON HCL 4 MG/2ML IJ SOLN
INTRAMUSCULAR | Status: AC
  Filled 2019-05-12: qty 2

## 2019-05-12 MED ORDER — MELOXICAM 7.5 MG PO TABS: 7.5000 mg | ORAL_TABLET | Freq: Every day | ORAL | 2 refills | Status: AC

## 2019-05-12 MED ORDER — OXYCODONE HCL 5 MG PO TABS: ORAL_TABLET | ORAL | 0 refills | Status: AC

## 2019-05-12 MED ORDER — OXYCODONE HCL 5 MG PO TABS
5.0000 mg | ORAL_TABLET | Freq: Once | ORAL | Status: AC | PRN
  Administered 2019-05-12: 5 mg via ORAL

## 2019-05-12 MED ORDER — DEXAMETHASONE SODIUM PHOSPHATE 4 MG/ML IJ SOLN
INTRAMUSCULAR | Status: DC | PRN
  Administered 2019-05-12: 10 mg via INTRAVENOUS

## 2019-05-12 MED ORDER — ONDANSETRON HCL 4 MG/2ML IJ SOLN: 4.0000 mg | Freq: Once | INTRAMUSCULAR | Status: DC | PRN

## 2019-05-12 MED ORDER — SODIUM CHLORIDE 0.9 % IR SOLN
Status: DC | PRN
  Administered 2019-05-12: 1000 mL

## 2019-05-12 MED ORDER — KETOROLAC TROMETHAMINE 30 MG/ML IJ SOLN
INTRAMUSCULAR | Status: AC
  Filled 2019-05-12: qty 1

## 2019-05-12 MED ORDER — LIDOCAINE 2% (20 MG/ML) 5 ML SYRINGE
INTRAMUSCULAR | Status: DC | PRN
  Administered 2019-05-12: 50 mg via INTRAVENOUS

## 2019-05-12 MED ORDER — ONDANSETRON HCL 4 MG/2ML IJ SOLN
INTRAMUSCULAR | Status: DC | PRN
  Administered 2019-05-12: 4 mg via INTRAVENOUS

## 2019-05-12 MED ORDER — VANCOMYCIN HCL 1000 MG IV SOLR
INTRAVENOUS | Status: AC
  Filled 2019-05-12: qty 1000

## 2019-05-12 MED ORDER — DEXAMETHASONE SODIUM PHOSPHATE 10 MG/ML IJ SOLN
INTRAMUSCULAR | Status: AC
  Filled 2019-05-12: qty 1

## 2019-05-12 MED ORDER — SCOPOLAMINE 1 MG/3DAYS TD PT72
MEDICATED_PATCH | TRANSDERMAL | Status: AC
  Filled 2019-05-12: qty 1

## 2019-05-12 MED ORDER — CEFAZOLIN SODIUM-DEXTROSE 2-4 GM/100ML-% IV SOLN
INTRAVENOUS | Status: AC
  Filled 2019-05-12: qty 100

## 2019-05-12 MED ORDER — ONDANSETRON HCL 4 MG PO TABS: 4.0000 mg | ORAL_TABLET | Freq: Three times a day (TID) | ORAL | 1 refills | Status: AC | PRN

## 2019-05-12 MED ORDER — ACETAMINOPHEN 500 MG PO TABS: 1000.0000 mg | ORAL_TABLET | Freq: Three times a day (TID) | ORAL | 0 refills | Status: AC

## 2019-05-12 MED ORDER — CEFAZOLIN SODIUM-DEXTROSE 2-4 GM/100ML-% IV SOLN
2.0000 g | INTRAVENOUS | Status: AC
  Administered 2019-05-12: 2 g via INTRAVENOUS

## 2019-05-12 MED ORDER — ASPIRIN 81 MG PO CHEW: 81.0000 mg | CHEWABLE_TABLET | Freq: Two times a day (BID) | ORAL | 11 refills | Status: AC

## 2019-05-12 MED ORDER — FENTANYL CITRATE (PF) 100 MCG/2ML IJ SOLN
50.0000 ug | INTRAMUSCULAR | Status: DC | PRN
  Administered 2019-05-12: 10:00:00 100 ug via INTRAVENOUS
  Administered 2019-05-12: 10:00:00 50 ug via INTRAVENOUS

## 2019-05-12 MED ORDER — KETOROLAC TROMETHAMINE 30 MG/ML IJ SOLN
INTRAMUSCULAR | Status: DC | PRN
  Administered 2019-05-12: 30 mg via INTRAVENOUS

## 2019-05-12 MED ORDER — PROPOFOL 10 MG/ML IV BOLUS
INTRAVENOUS | Status: DC | PRN
  Administered 2019-05-12: 200 mg via INTRAVENOUS

## 2019-05-12 MED ORDER — MIDAZOLAM HCL 2 MG/2ML IJ SOLN
INTRAMUSCULAR | Status: AC
  Filled 2019-05-12: qty 2

## 2019-05-12 MED ORDER — CHLORHEXIDINE GLUCONATE 4 % EX LIQD: 60.0000 mL | Freq: Once | CUTANEOUS | Status: DC

## 2019-05-12 MED ORDER — LIDOCAINE 2% (20 MG/ML) 5 ML SYRINGE
INTRAMUSCULAR | Status: AC
  Filled 2019-05-12: qty 5

## 2019-05-12 MED ORDER — OXYCODONE HCL 5 MG PO TABS
ORAL_TABLET | ORAL | Status: AC
  Filled 2019-05-12: qty 1

## 2019-05-12 MED ORDER — SCOPOLAMINE 1 MG/3DAYS TD PT72
1.0000 | MEDICATED_PATCH | Freq: Once | TRANSDERMAL | Status: DC | PRN
  Administered 2019-05-12: 08:00:00 1.5 mg via TRANSDERMAL

## 2019-05-12 MED ORDER — FENTANYL CITRATE (PF) 100 MCG/2ML IJ SOLN: 25.0000 ug | INTRAMUSCULAR | Status: DC | PRN

## 2019-05-12 MED ORDER — PROPOFOL 500 MG/50ML IV EMUL
INTRAVENOUS | Status: AC
  Filled 2019-05-12: qty 50

## 2019-05-12 MED ORDER — BUPIVACAINE HCL (PF) 0.25 % IJ SOLN
INTRAMUSCULAR | Status: DC | PRN
  Administered 2019-05-12: 20 mL via INTRA_ARTICULAR

## 2019-05-12 SURGICAL SUPPLY — 72 items
BANDAGE ACE 4X5 VEL STRL LF (GAUZE/BANDAGES/DRESSINGS) ×4 IMPLANT
BANDAGE ACE 6X5 VEL STRL LF (GAUZE/BANDAGES/DRESSINGS) ×4 IMPLANT
BANDAGE ESMARK 6X9 LF (GAUZE/BANDAGES/DRESSINGS) IMPLANT
BENZOIN TINCTURE PRP APPL 2/3 (GAUZE/BANDAGES/DRESSINGS) IMPLANT
BLADE CLIPPER SURG (BLADE) IMPLANT
BLADE SURG 10 STRL SS (BLADE) ×4 IMPLANT
BLADE SURG 15 STRL LF DISP TIS (BLADE) ×2 IMPLANT
BLADE SURG 15 STRL SS (BLADE) ×2
BNDG ESMARK 6X9 LF (GAUZE/BANDAGES/DRESSINGS)
CHLORAPREP W/TINT 26 (MISCELLANEOUS) ×4 IMPLANT
CLOSURE STERI-STRIP 1/2X4 (GAUZE/BANDAGES/DRESSINGS)
CLOSURE WOUND 1/2 X4 (GAUZE/BANDAGES/DRESSINGS) ×1
CLSR STERI-STRIP ANTIMIC 1/2X4 (GAUZE/BANDAGES/DRESSINGS) IMPLANT
COVER WAND RF STERILE (DRAPES) IMPLANT
CUFF TOURN SGL QUICK 34 (TOURNIQUET CUFF) ×2
CUFF TRNQT CYL 34X4.125X (TOURNIQUET CUFF) ×2 IMPLANT
DECANTER SPIKE VIAL GLASS SM (MISCELLANEOUS) IMPLANT
DISSECTOR 3.5MM X 13CM CVD (MISCELLANEOUS) IMPLANT
DISSECTOR 4.0MMX13CM CVD (MISCELLANEOUS) IMPLANT
DRAPE ARTHROSCOPY W/POUCH 90 (DRAPES) ×4 IMPLANT
DRAPE EXTREMITY T 121X128X90 (DISPOSABLE) IMPLANT
DRAPE IMP U-DRAPE 54X76 (DRAPES) ×4 IMPLANT
DRAPE INCISE IOBAN 66X45 STRL (DRAPES) IMPLANT
DRAPE U-SHAPE 47X51 STRL (DRAPES) ×4 IMPLANT
DRSG AQUACEL AG ADV 3.5X10 (GAUZE/BANDAGES/DRESSINGS) IMPLANT
ELECT REM PT RETURN 9FT ADLT (ELECTROSURGICAL) ×4
ELECTRODE REM PT RTRN 9FT ADLT (ELECTROSURGICAL) ×2 IMPLANT
GAUZE SPONGE 4X4 12PLY STRL (GAUZE/BANDAGES/DRESSINGS) ×4 IMPLANT
GAUZE XEROFORM 1X8 LF (GAUZE/BANDAGES/DRESSINGS) IMPLANT
GLOVE BIOGEL PI IND STRL 7.0 (GLOVE) ×6 IMPLANT
GLOVE BIOGEL PI IND STRL 8 (GLOVE) ×2 IMPLANT
GLOVE BIOGEL PI INDICATOR 7.0 (GLOVE) ×6
GLOVE BIOGEL PI INDICATOR 8 (GLOVE) ×2
GLOVE ECLIPSE 6.5 STRL STRAW (GLOVE) ×4 IMPLANT
GLOVE ECLIPSE 8.0 STRL XLNG CF (GLOVE) ×8 IMPLANT
GLOVE SURG SS PI 6.5 STRL IVOR (GLOVE) ×4 IMPLANT
GOWN STRL REUS W/ TWL LRG LVL3 (GOWN DISPOSABLE) ×4 IMPLANT
GOWN STRL REUS W/TWL LRG LVL3 (GOWN DISPOSABLE) ×4
GOWN STRL REUS W/TWL XL LVL3 (GOWN DISPOSABLE) ×4 IMPLANT
IV NS IRRIG 3000ML ARTHROMATIC (IV SOLUTION) ×4 IMPLANT
KIT TURNOVER KIT B (KITS) ×4 IMPLANT
MANIFOLD NEPTUNE II (INSTRUMENTS) ×4 IMPLANT
NDL SAFETY ECLIPSE 18X1.5 (NEEDLE) ×2 IMPLANT
NEEDLE HYPO 18GX1.5 SHARP (NEEDLE) ×2
NS IRRIG 1000ML POUR BTL (IV SOLUTION) IMPLANT
PACK ARTHROSCOPY DSU (CUSTOM PROCEDURE TRAY) ×4 IMPLANT
PACK BASIN DAY SURGERY FS (CUSTOM PROCEDURE TRAY) ×4 IMPLANT
PAD ARMBOARD 7.5X6 YLW CONV (MISCELLANEOUS) IMPLANT
PAD CAST 4YDX4 CTTN HI CHSV (CAST SUPPLIES) IMPLANT
PADDING CAST COTTON 4X4 STRL (CAST SUPPLIES)
PADDING CAST COTTON 6X4 STRL (CAST SUPPLIES) IMPLANT
PENCIL BUTTON HOLSTER BLD 10FT (ELECTRODE) ×4 IMPLANT
SLEEVE SCD COMPRESS KNEE MED (MISCELLANEOUS) IMPLANT
SPONGE LAP 18X18 RF (DISPOSABLE) ×4 IMPLANT
STAPLER VISISTAT 35W (STAPLE) IMPLANT
STRIP CLOSURE SKIN 1/2X4 (GAUZE/BANDAGES/DRESSINGS) ×3 IMPLANT
SUCTION FRAZIER HANDLE 10FR (MISCELLANEOUS) ×2
SUCTION TUBE FRAZIER 10FR DISP (MISCELLANEOUS) ×2 IMPLANT
SUT MNCRL AB 4-0 PS2 18 (SUTURE) ×4 IMPLANT
SUT VIC AB 0 CT1 18XCR BRD 8 (SUTURE) IMPLANT
SUT VIC AB 0 CT1 27 (SUTURE) ×2
SUT VIC AB 0 CT1 27XBRD ANBCTR (SUTURE) ×2 IMPLANT
SUT VIC AB 0 CT1 8-18 (SUTURE)
SUT VIC AB 2-0 SH 27 (SUTURE)
SUT VIC AB 2-0 SH 27XBRD (SUTURE) IMPLANT
SUT VIC AB 3-0 SH 27 (SUTURE) ×2
SUT VIC AB 3-0 SH 27X BRD (SUTURE) ×2 IMPLANT
SYR 5ML LUER SLIP (SYRINGE) ×4 IMPLANT
SYR BULB 3OZ (MISCELLANEOUS) IMPLANT
TOWEL GREEN STERILE FF (TOWEL DISPOSABLE) ×4 IMPLANT
TUBE SUCTION HIGH CAP CLEAR NV (SUCTIONS) IMPLANT
TUBING ARTHROSCOPY IRRIG 16FT (MISCELLANEOUS) ×4 IMPLANT

## 2019-05-12 NOTE — Anesthesia Procedure Notes (Signed)
Procedure Name: LMA Insertion Performed by: Levone Otten W, CRNA Pre-anesthesia Checklist: Patient identified, Emergency Drugs available, Suction available and Patient being monitored Patient Re-evaluated:Patient Re-evaluated prior to induction Oxygen Delivery Method: Circle system utilized Preoxygenation: Pre-oxygenation with 100% oxygen Induction Type: IV induction Ventilation: Mask ventilation without difficulty LMA: LMA inserted LMA Size: 4.0 Number of attempts: 1 Placement Confirmation: positive ETCO2 Tube secured with: Tape Dental Injury: Teeth and Oropharynx as per pre-operative assessment        

## 2019-05-12 NOTE — Discharge Instructions (Signed)

## 2019-05-12 NOTE — H&P (Signed)
PREOPERATIVE H&P  Chief Complaint: INTERTROCHANTERIC FEMUR FRACTURE RIGHT KNEE, PLICA SYNDROME RIGHT KNEE, CALCIFIC TENDONITIS RIGHT LOWER LEG  HPI: Rebecca Moses is a 28 y.o. female who presents for preoperative history and physical with a diagnosis of INTERTROCHANTERIC FEMUR FRACTURE RIGHT KNEE, PLICA SYNDROME RIGHT KNEE, CALCIFIC TENDONITIS RIGHT LOWER LEG. Symptoms are rated as moderate to severe, and have been worsening.  This is significantly impairing activities of daily living.  Please see my clinic note for full details on this patient's care.  She has elected for surgical management.   Past Medical History:  Diagnosis Date  . Medical history non-contributory   . PONV (postoperative nausea and vomiting)    Past Surgical History:  Procedure Laterality Date  . NO PAST SURGERIES    . WISDOM TOOTH EXTRACTION     Social History   Socioeconomic History  . Marital status: Married    Spouse name: Not on file  . Number of children: Not on file  . Years of education: Not on file  . Highest education level: Not on file  Occupational History  . Not on file  Social Needs  . Financial resource strain: Not on file  . Food insecurity    Worry: Not on file    Inability: Not on file  . Transportation needs    Medical: Not on file    Non-medical: Not on file  Tobacco Use  . Smoking status: Never Smoker  . Smokeless tobacco: Never Used  Substance and Sexual Activity  . Alcohol use: No    Frequency: Never  . Drug use: No  . Sexual activity: Yes    Birth control/protection: Condom  Lifestyle  . Physical activity    Days per week: Not on file    Minutes per session: Not on file  . Stress: Not on file  Relationships  . Social Herbalist on phone: Not on file    Gets together: Not on file    Attends religious service: Not on file    Active member of club or organization: Not on file    Attends meetings of clubs or organizations: Not on file    Relationship  status: Not on file  Other Topics Concern  . Not on file  Social History Narrative  . Not on file   Family History  Problem Relation Age of Onset  . Skin cancer Mother   . Skin cancer Father   . Skin cancer Paternal Grandmother   . Skin cancer Paternal Grandfather   . Diabetes Paternal Grandfather    No Known Allergies Prior to Admission medications   Not on File     Positive ROS: All other systems have been reviewed and were otherwise negative with the exception of those mentioned in the HPI and as above.  Physical Exam: General: Alert, no acute distress Cardiovascular: No pedal edema Respiratory: No cyanosis, no use of accessory musculature GI: No organomegaly, abdomen is soft and non-tender Skin: No lesions in the area of chief complaint Neurologic: Sensation intact distally Psychiatric: Patient is competent for consent with normal mood and affect Lymphatic: No axillary or cervical lymphadenopathy  MUSCULOSKELETAL: RLE: mass again noted, wwp foot, no change  Assessment: RIGHT KNEE, PLICA SYNDROME RIGHT KNEE, CALCIFIC TENDONITIS RIGHT LOWER LEG  Plan: Plan for Procedure(s): RIGHT KNEE ARTHROSCOPY WITH SYNOVECTOMY LIMITED RIGHT KNEE EXCISION TUMOR/CALCIFICATION  The risks benefits and alternatives were discussed with the patient including but not limited to the risks of nonoperative treatment, versus  surgical intervention including infection, bleeding, nerve injury,  blood clots, cardiopulmonary complications, morbidity, mortality, among others, and they were willing to proceed.   Hiram Gash, MD  05/12/2019 9:23 AM

## 2019-05-12 NOTE — Anesthesia Preprocedure Evaluation (Addendum)
Anesthesia Evaluation  Patient identified by MRN, date of birth, ID band Patient awake    Reviewed: Allergy & Precautions, NPO status , Patient's Chart, lab work & pertinent test results  History of Anesthesia Complications (+) PONVNegative for: history of anesthetic complications  Airway Mallampati: I  TM Distance: >3 FB Neck ROM: Full    Dental no notable dental hx. (+) Teeth Intact   Pulmonary neg pulmonary ROS,    Pulmonary exam normal        Cardiovascular negative cardio ROS Normal cardiovascular exam     Neuro/Psych negative neurological ROS  negative psych ROS   GI/Hepatic negative GI ROS, Neg liver ROS,   Endo/Other  negative endocrine ROS  Renal/GU negative Renal ROS  negative genitourinary   Musculoskeletal negative musculoskeletal ROS (+)   Abdominal   Peds  Hematology negative hematology ROS (+)   Anesthesia Other Findings   Reproductive/Obstetrics                            Anesthesia Physical Anesthesia Plan  ASA: I  Anesthesia Plan: General   Post-op Pain Management:    Induction: Intravenous  PONV Risk Score and Plan: 3 and Ondansetron, Dexamethasone, Midazolam, Treatment may vary due to age or medical condition and Scopolamine patch - Pre-op  Airway Management Planned: LMA  Additional Equipment: None  Intra-op Plan:   Post-operative Plan: Extubation in OR  Informed Consent: I have reviewed the patients History and Physical, chart, labs and discussed the procedure including the risks, benefits and alternatives for the proposed anesthesia with the patient or authorized representative who has indicated his/her understanding and acceptance.     Dental advisory given  Plan Discussed with:   Anesthesia Plan Comments:        Anesthesia Quick Evaluation

## 2019-05-12 NOTE — Transfer of Care (Signed)
Immediate Anesthesia Transfer of Care Note  Patient: Rebecca Moses  Procedure(s) Performed: DIAGNOSTIC RIGHT KNEE ARTHROSCOPY (Right Knee) RIGHT KNEE OPEN EXCISION SOFT TISSUE MASS (Right Knee)  Patient Location: PACU  Anesthesia Type:General  Level of Consciousness: awake and sedated  Airway & Oxygen Therapy: Patient Spontanous Breathing and Patient connected to nasal cannula oxygen  Post-op Assessment: Report given to RN and Post -op Vital signs reviewed and stable  Post vital signs: Reviewed and stable  Last Vitals:  Vitals Value Taken Time  BP 102/62 05/12/19 1103  Temp    Pulse 72 05/12/19 1105  Resp 13 05/12/19 1105  SpO2 100 % 05/12/19 1105  Vitals shown include unvalidated device data.  Last Pain:  Vitals:   05/12/19 0803  TempSrc: Oral  PainSc: 0-No pain         Complications: No apparent anesthesia complications

## 2019-05-12 NOTE — Anesthesia Postprocedure Evaluation (Signed)
Anesthesia Post Note  Patient: Rebecca Moses  Procedure(s) Performed: DIAGNOSTIC RIGHT KNEE ARTHROSCOPY (Right Knee) RIGHT KNEE OPEN EXCISION SOFT TISSUE MASS (Right Knee)     Patient location during evaluation: PACU Anesthesia Type: General Level of consciousness: awake and alert Pain management: pain level controlled Vital Signs Assessment: post-procedure vital signs reviewed and stable Respiratory status: spontaneous breathing, nonlabored ventilation and respiratory function stable Cardiovascular status: blood pressure returned to baseline and stable Postop Assessment: no apparent nausea or vomiting Anesthetic complications: no    Last Vitals:  Vitals:   05/12/19 1115 05/12/19 1130  BP: 109/63 108/72  Pulse: 67 (!) 58  Resp: 16 13  Temp:    SpO2: 100% 100%    Last Pain:  Vitals:   05/12/19 1130  TempSrc:   PainSc: 3                  Lidia Collum

## 2019-05-12 NOTE — Op Note (Signed)
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  The mass itself was marginally circumscribed but was not very adherent to the underlying tissue.  We are able to remove it without issue.  We are able to probe in the infrapatellar space just anterior to the fat pad noted that there is no involvement of the joint that was apparent.  There is no residual tumor or adherent tissue.  We closed the peritenon after obtaining hemostasis.  The joint itself was completely unremarkable.  No areas of cartilage damage, no sign of PVNS at this time within the joint.  We were careful to not use a lateral portal at all and used a separate stab incision for a medial portal and did not use a probe.  With this we were able to avoid contamination ideally of the joint.  Have a low threshold for utilizing physical therapy as we did have to dissect behind the patient's patellar tendon extensively.  She does have a risk of developing adhesions in this area.  Post-operative plan: The patient will be weightbearing as tolerated.  The patient will be discharged home.  DVT prophylaxis aspirin 81 mg twice daily.  Pain control with PRN pain medication preferring oral medicines.  Follow up plan will be scheduled in approximately 7 days for incision check.  Post-Op Diagnosis: Same Surgeons:Primary: Hiram Gash, MD Assistants: Norris Cross, RN Location: [facility] OR ROOM 6 Anesthesia: General Antibiotics: Ancef 2g preop, Vancomycin 1000mg  locally  Tourniquet time:  Total Tourniquet Time Documented: Thigh (Right) - 26 minutes Total: Thigh (Right) - 26 minutes  Estimated Blood Loss: Minimal Complications: None Specimens:  Right knee mass for permanent pathology Implants: * No implants in log *  Indications for Surgery:   Rebecca Moses is a 28 y.o. female with longstanding increasing to her knee pain with a mass that seem to be increasing in size.  Careful work-up including a serial set of x-rays as well as MRI with and without contrast was performed.  He had the patient evaluated by our orthopedic oncology team at [facility] including Dr. 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  Patient was then reported to be stable for me to resect the lesion.  Patient did continue to have anterior knee pain and we are worried that there may be some underlying joint pathology anterior in the joint secondary to the location of the lesion.  It was felt the diagnostic arthroscopy would be appropriate to double check that there was no joint involvement while taking great care not to see the joint.  Benefits and risks of operative and nonoperative management were discussed prior to surgery with patient/guardian(s) and informed consent form was completed.  Specific risks including infection, need for additional surgery, infection, recurrence and continued pain.   Procedure:   The patient was identified in the preoperative holding area where the surgical site was marked. The patient was taken to the OR where a procedural timeout was called and the above noted anesthesia was induced.  The patient was positioned supine on a regular bed.  Preoperative antibiotics were dosed.  The patient's right knee was prepped and draped in the usual sterile fashion.  A  second preoperative timeout was called.      A tourniquet was used for the above listed time.   We began by benefiting the lesion that was primarily palpable just lateral to the patellar tendon.  There was not a clear path that we could perform arthroscopy prior to resecting the lesion we did not want to go through the lesion in order to  get into the joint.  Felt that it was appropriate to take out the lesion prior to going forward with arthroscopy.  Longitudinal incision was made about 4-1/2 cm in length overlying the mass itself just off the lateral border the patellar tendon.  Went through skin sharply achieving hemostasis we progressed and identified the peritenon.  Layers were lifted and the peritenon was opened sharply.  We able to dissect laterally and identify the majority of the mass.  This point we used careful dissection to remove the mass bluntly from the underlying tissue which included the bone distally as well as just at the insertion of the patellar tendon.  We able to remove the mass and block and noted that it went all the way across the patellar tendon to the medial side.  We were able to remove it without really exposing the medial side.  We were manually able to check that there was no residual tendon.  Visually we then verify that this was again true.  The mass was about 8 x 6 cm in size.  There is multiloculated and appeared consistent with my prior experiences with PVNS however we felt that was appropriate to send the pathology for permanent specimen.  This point the tumor bed was carefully and thoroughly irrigated.  Tourniquet was released and adequate hemostasis was obtained.  We noted that there was no residual tumorand there was fat pad seen to be mostly intact without violating the joint.  This was not 100% clear however we felt that in the patient's setting of knee pain we felt that it was appropriate to at least visualize the joint and note that there was no significant PVNS disease within the joint.  At this point we felt that going in through a lateral portal would be risky as this was more in line with where the tumor itself was.  We spinal needle localization and performed a diagnostic arthroscopy only using a medial portal.  No instruments were inserted other than the scope.  There is no sign of disease  within the joint and all 3 compartments were cleared.  Everything appeared to be normal.  Exam under anesthesia was normal.  Incision was again irrigated.  We closed the peritenon tightly after again verifying hemostasis.  Vancomycin powder was placed.  Multilayer fashion was used to close the skin.  A sterile dressing was placed.   The patient was awoken from general anesthesia and taken to the PACU in stable condition without complication.

## 2019-05-13 ENCOUNTER — Encounter (HOSPITAL_BASED_OUTPATIENT_CLINIC_OR_DEPARTMENT_OTHER): Payer: Self-pay | Admitting: Orthopaedic Surgery

## 2019-09-26 ENCOUNTER — Other Ambulatory Visit: Payer: Self-pay

## 2019-09-26 DIAGNOSIS — Z20822 Contact with and (suspected) exposure to covid-19: Secondary | ICD-10-CM

## 2019-09-27 LAB — NOVEL CORONAVIRUS, NAA: SARS-CoV-2, NAA: NOT DETECTED

## 2019-10-21 ENCOUNTER — Other Ambulatory Visit: Payer: Self-pay

## 2019-10-21 DIAGNOSIS — Z20822 Contact with and (suspected) exposure to covid-19: Secondary | ICD-10-CM

## 2019-10-24 LAB — NOVEL CORONAVIRUS, NAA: SARS-CoV-2, NAA: NOT DETECTED

## 2020-04-16 ENCOUNTER — Ambulatory Visit: Payer: Self-pay | Attending: Internal Medicine

## 2020-04-16 DIAGNOSIS — Z20822 Contact with and (suspected) exposure to covid-19: Secondary | ICD-10-CM

## 2020-04-17 LAB — NOVEL CORONAVIRUS, NAA: SARS-CoV-2, NAA: NOT DETECTED

## 2020-04-17 LAB — SARS-COV-2, NAA 2 DAY TAT
